# Patient Record
Sex: Female | Born: 1980 | Race: Black or African American | Hispanic: No | Marital: Married | Smoking: Never smoker
Health system: Southern US, Community
[De-identification: ages and names within clinical notes are randomized; demographics above are authoritative.]

## PROBLEM LIST (undated history)

## (undated) DIAGNOSIS — D649 Anemia, unspecified: Secondary | ICD-10-CM

---

## 2014-04-25 ENCOUNTER — Ambulatory Visit (INDEPENDENT_AMBULATORY_CARE_PROVIDER_SITE_OTHER): Payer: Self-pay

## 2014-04-25 ENCOUNTER — Ambulatory Visit: Payer: Self-pay

## 2014-04-25 ENCOUNTER — Ambulatory Visit (INDEPENDENT_AMBULATORY_CARE_PROVIDER_SITE_OTHER): Payer: Self-pay | Admitting: Family Medicine

## 2014-04-25 VITALS — BP 114/70 | HR 78 | Temp 98.2°F | Resp 18 | Ht 64.0 in | Wt 186.0 lb

## 2014-04-25 DIAGNOSIS — D649 Anemia, unspecified: Secondary | ICD-10-CM

## 2014-04-25 DIAGNOSIS — M25561 Pain in right knee: Secondary | ICD-10-CM

## 2014-04-25 DIAGNOSIS — M25562 Pain in left knee: Secondary | ICD-10-CM

## 2014-04-25 DIAGNOSIS — M7630 Iliotibial band syndrome, unspecified leg: Secondary | ICD-10-CM

## 2014-04-25 DIAGNOSIS — K146 Glossodynia: Secondary | ICD-10-CM

## 2014-04-25 LAB — POCT CBC
Granulocyte percent: 64.3 %G (ref 37–80)
HEMATOCRIT: 36.7 % — AB (ref 37.7–47.9)
HEMOGLOBIN: 11.7 g/dL — AB (ref 12.2–16.2)
Lymph, poc: 1.6 (ref 0.6–3.4)
MCH: 28.3 pg (ref 27–31.2)
MCHC: 31.7 g/dL — AB (ref 31.8–35.4)
MCV: 89.2 fL (ref 80–97)
MID (cbc): 0.4 (ref 0–0.9)
MPV: 8.6 fL (ref 0–99.8)
POC Granulocyte: 3.5 (ref 2–6.9)
POC LYMPH %: 28.4 % (ref 10–50)
POC MID %: 7.3 %M (ref 0–12)
Platelet Count, POC: 223 10*3/uL (ref 142–424)
RBC: 4.12 M/uL (ref 4.04–5.48)
RDW, POC: 13.6 %
WBC: 5.5 10*3/uL (ref 4.6–10.2)

## 2014-04-25 MED ORDER — LIDOCAINE VISCOUS 2 % MT SOLN: 10.0000 mL | OROMUCOSAL | Status: DC | PRN

## 2014-04-25 NOTE — Progress Notes (Signed)
Patient ID: Chelsea Ramirez, female   DOB: [birth year], 34 y.o.   MRN: [id number] Reviewed documentation and xray. Pt indepenedently seen by myself and agree w/ assessment and plan. Delman Cheadle, MD MPH

## 2014-04-25 NOTE — Progress Notes (Signed)
04/25/2014 at 5:56 PM  Chelsea Ramirez / DOB: 01/18/1980 / MRN: [id number]  The patient  does not have a problem list on file.  SUBJECTIVE  Chief complaint: Tongue Pain and Knee Pain   History of present illness: Chelsea Ramirez is 34 y.o. well appearing female presenting for  tongue pain which she states is moderate. This started 7 days ago and is worsening . Associated symptoms include pain with eating.  She denies swelling of the tongue and throat, and denies changes in color. Nothing aggravates her symptoms, and nothing alleviates them. She has tried nothing for this problem. Of note, she has been on a diet for the last two months in which she has cut out fast foods, and has been eating lots of meat but very little fruits and vegetables.   She also complains of worsening moderate bilateral knee pain. The right knee has been hurting for roughly 1 year, and the left over the last months.  Associated symptoms include bilateral lateral thigh pain and she denies swelling and warmth of the knee joints. She has tried 400 mg of Ibuprofen with good relief. Going up and down stairs and standing for long periods makes her pain worse.      She  has no past medical history on file.    Medications reviewed and updated by myself where necessary, and exist elsewhere in the encounter.   Chelsea Ramirez has No Known Allergies. She  reports that she has never smoked. She does not have any smokeless tobacco history on file. She reports that she does not drink alcohol or use illicit drugs. She  has no sexual activity history on file. The patient  has no past surgical history on file.  Her family history is not on file.  Review of Systems  Constitutional: Negative for fever and chills.  Respiratory: Negative for cough.   Cardiovascular: Negative for chest pain.  Musculoskeletal: Positive for joint pain. Negative for myalgias, back pain, falls and neck pain.  Neurological: Negative for dizziness and headaches.     OBJECTIVE  Her  height is 5\' 4"  (1.626 m) and weight is 186 lb (84.369 kg). Her oral temperature is 98.2 F (36.8 C). Her blood pressure is 114/70 and her pulse is 78. Her respiration is 18 and oxygen saturation is 100%.  The patient's body mass index is 31.91 kg/(m^2).  Physical Exam  Constitutional: She is oriented to person, place, and time. She appears well-developed and well-nourished.  HENT:  Mouth/Throat: Uvula is midline, oropharynx is clear and moist and mucous membranes are normal.    Neck: Normal range of motion.  Cardiovascular: Normal rate.   Respiratory: Effort normal.  GI: Soft. She exhibits no distension.  Musculoskeletal: Normal range of motion. She exhibits no edema or tenderness.       Right knee: Normal.       Left knee: Normal.       Legs: Neurological: She is alert and oriented to person, place, and time.  Skin: Skin is warm and dry.  Psychiatric: She has a normal mood and affect. Her behavior is normal. Judgment and thought content normal.    Results for orders placed or performed in visit on 04/25/14 (from the past 24 hour(s))  POCT CBC     Status: Abnormal   Collection Time: 04/25/14  5:51 PM  Result Value Ref Range   WBC 5.5 4.6 - 10.2 K/uL   Lymph, poc 1.6 0.6 - 3.4   POC LYMPH PERCENT 28.4  10 - 50 %L   MID (cbc) 0.4 0 - 0.9   POC MID % 7.3 0 - 12 %M   POC Granulocyte 3.5 2 - 6.9   Granulocyte percent 64.3 37 - 80 %G   RBC 4.12 4.04 - 5.48 M/uL   Hemoglobin 11.7 (A) 12.2 - 16.2 g/dL   HCT, POC 36.7 (A) 37.7 - 47.9 %   MCV 89.2 80 - 97 fL   MCH, POC 28.3 27 - 31.2 pg   MCHC 31.7 (A) 31.8 - 35.4 g/dL   RDW, POC 13.6 %   Platelet Count, POC 223 142 - 424 K/uL   MPV 8.6 0 - 99.8 fL   [facility] reading (PRIMARY) by  Dr. Brigitte Pulse: Bilateral knee films without osseous abnormalities.   ASSESSMENT & PLAN  Lizette was seen today for tongue pain and knee pain.  Diagnoses and all orders for this visit:  Bilateral knee pain: Physical exam and  radiographs unremarkable aside from some tenderness along the IT bands bilaterally.  Most likely musculoskeletal in nature.  Will send to PT for evaluation and treatment and have expressed the importance of the patient presenting for that appointment.  Orders: -     DG Knee Complete 4 Views Left; Future -     DG Knee Complete 4 Views Right; Future -     POCT CBC  Painful tongue: Unknown etiology at this point.  Mild anemia present and no previous CBC to compare to.  Will check folate, B12, ferritin, TIBC and Iron to further understand the nature of this anemia in the hopes it will shed light on her tongue pain.   Orders: -     Cancel: CBC with Differential/Platelet -     TSH -     POCT CBC   The patient was advised to call or come back to clinic if she does not see an improvement in symptoms, or worsens with the above plan.   Philis Fendt, MHS, PA-C [facility] 04/25/2014 5:56 PM

## 2014-04-26 LAB — FOLATE: FOLATE: 11 ng/mL

## 2014-04-26 LAB — IRON AND TIBC
%SAT: 16 % — AB (ref 20–55)
IRON: 49 ug/dL (ref 42–145)
TIBC: 315 ug/dL (ref 250–470)
UIBC: 266 ug/dL (ref 125–400)

## 2014-04-26 LAB — VITAMIN B12: Vitamin B-12: 754 pg/mL (ref 211–911)

## 2014-04-26 LAB — TSH: TSH: 1.333 u[IU]/mL (ref 0.350–4.500)

## 2014-04-26 LAB — FERRITIN: FERRITIN: 36 ng/mL (ref 10–291)

## 2014-11-02 ENCOUNTER — Ambulatory Visit (INDEPENDENT_AMBULATORY_CARE_PROVIDER_SITE_OTHER): Payer: Self-pay | Admitting: Family Medicine

## 2014-11-02 ENCOUNTER — Ambulatory Visit (INDEPENDENT_AMBULATORY_CARE_PROVIDER_SITE_OTHER): Payer: Self-pay

## 2014-11-02 ENCOUNTER — Ambulatory Visit: Payer: Self-pay

## 2014-11-02 VITALS — BP 124/80 | HR 79 | Temp 98.4°F | Resp 18 | Ht 65.5 in | Wt 185.0 lb

## 2014-11-02 DIAGNOSIS — M25552 Pain in left hip: Secondary | ICD-10-CM

## 2014-11-02 DIAGNOSIS — M25562 Pain in left knee: Secondary | ICD-10-CM

## 2014-11-02 DIAGNOSIS — M25561 Pain in right knee: Secondary | ICD-10-CM

## 2014-11-02 DIAGNOSIS — Z23 Encounter for immunization: Secondary | ICD-10-CM

## 2014-11-02 MED ORDER — MELOXICAM 7.5 MG PO TABS: 7.5000 mg | ORAL_TABLET | Freq: Every day | ORAL | Status: DC

## 2014-11-02 NOTE — Progress Notes (Signed)
Subjective:  This chart was scribed for Chelsea Ray, MD by Leandra Kern, Medical Scribe. This patient was seen in Room 9 and the patient's care was started at 12:36 PM.   Patient ID: Chelsea Ramirez, female    DOB: [birth year], 34 y.o.   MRN: [id number]  Chief Complaint  Patient presents with  . Follow-up    seen in 04/2014 continues to have pain  . Knee Injury    both knees  . Flu Vaccine    HPI HPI Comments: Chelsea Ramirez is a 34 y.o. female who presents to [facility] with her sister as an interpreter  for a follow up on knee pain. She had BL knee pain when seen in April of this year by Philis Fendt PA-C. At that time, left knee pain was present for three months, and the right for 1 year. Pain was worst with going up or down the stairs, and standing up for long periods of time. Pt was using Ibuprofen for the pain. Suspected IT band issue. Discuessed physcial therapy.   Pt notes that the pain has worsened since her last visit in April. She indicates that the pain is worst with any activity, and it is accompanied by knee swelling at time. Pt states that the pain in the left knee radiated form around the hip and radius to her lower leg, onset two months ago, however the pain is localized in the right knee.  Pt is not currently taking ibuprofen for the pain, and has not followed up with physical therapy. She denies any new traumatic injuries to the area, or back pain.   Pt is also requesting a flu shot today, and denies any recent sickness.  Pt works as a Public librarian.   There are no active problems to display for this patient.  History reviewed. No pertinent past medical history. History reviewed. No pertinent past surgical history. No Known Allergies Prior to Admission medications   Medication Sig Start Date End Date Taking? Authorizing Provider  lidocaine (XYLOCAINE) 2 % solution Use as directed 10 mLs in the mouth or throat every 3 (three) hours as  needed for mouth pain. Patient not taking: Reported on 11/02/2014 04/25/14   Tereasa Coop, PA-C   Social History   Social History  . Marital Status: Married    Spouse Name: N/A  . Number of Children: N/A  . Years of Education: N/A   Occupational History  . Not on file.   Social History Main Topics  . Smoking status: Never Smoker   . Smokeless tobacco: Not on file  . Alcohol Use: No  . Drug Use: No  . Sexual Activity: Not on file   Other Topics Concern  . Not on file   Social History Narrative    Review of Systems  Musculoskeletal: Positive for joint swelling and arthralgias.      Objective:   Physical Exam  Constitutional: She is oriented to person, place, and time. She appears well-developed and well-nourished. No distress.  HENT:  Head: Normocephalic and atraumatic.  Eyes: EOM are normal. Pupils are equal, round, and reactive to light.  Neck: Neck supple.  Cardiovascular: Normal rate.   Pulmonary/Chest: Effort normal.  Musculoskeletal:  Right knee: FROM. Skin is intact, no effusion, tenderness along the medial joint line. Initially non tender latera, but on repeated exam she was tender on both lateral and medial joints. Distal hamstrings  Left Knee: Possible trace effusion, but skin is intact. She  is guarded, but I am able to get her to 90 degree of flexion, full extension. Diffusely tender across her entire knee, no point is more tender than other. Negative varus and valgus test. She is guarded with McMurray for pain with flexion. Negative straight leg raise.   Left hip: internal and external rotation of the hip, tender along the anterior hip, but no pain with range of motion.   She is able to toe walk as well as heel walk. FROM of the lumbar spine. Lumbar spine is non tender. Sciatic notch was non tender.   Neurological: She is alert and oriented to person, place, and time. No cranial nerve deficit. She displays no Babinski's sign on the right side. She displays  no Babinski's sign on the left side.  Reflex Scores:      Patellar reflexes are 2+ on the right side and 2+ on the left side.      Achilles reflexes are 2+ on the right side and 2+ on the left side. Skin: Skin is warm and dry.  Psychiatric: She has a normal mood and affect. Her behavior is normal.  Nursing note and vitals reviewed.   Filed Vitals:   11/02/14 1207  BP: 124/80  Pulse: 79  Temp: 98.4 F (36.9 C)  TempSrc: Oral  Resp: 18  Height: 5' 5.5" (1.664 m)  Weight: 185 lb (83.915 kg)  SpO2: 97%   [facility] (PRIMARY) x-Ramirez report read by Dr. .Chelsea Ramirez: Left hip- no acute findings. Right knee- minimial degenerative change medially. Left knee- minimal degenerative change medially.      Assessment & Plan:   Chelsea Ramirez is a 34 y.o. female Left hip pain - Plan: DG HIP UNILAT W OR W/O PELVIS 2-3 VIEWS LEFT, meloxicam (MOBIC) 7.5 MG tablet, Ambulatory referral to Physical Therapy  Arthralgia of both knees - Plan: DG Knee Complete 4 Views Left, DG Knee Complete 4 Views Right, meloxicam (MOBIC) 7.5 MG tablet, Ambulatory referral to Physical Therapy  Need for influenza vaccination - Plan: Flu Vaccine QUAD 36+ mos IM, meloxicam (MOBIC) 7.5 MG tablet, Ambulatory referral to Physical Therapy  Recurrent, prolonged bilateral knee pain - left greater than right with suspected secondary hip pain from alteration in gait/stance for pain. Difficult exam, but possible slight effusion on left. Possible underlying OA with flair, vs degenerative meniscal disease. Initially suspected sciatica with radiating L leg pain, but back exam WNL.   - trial of mobic QD, refer to PT and consider ortho eval if worsening or not improved in next 6 weeks.   Meds ordered this encounter  Medications  . meloxicam (MOBIC) 7.5 MG tablet    Sig: Take 1 tablet (7.5 mg total) by mouth daily.    Dispense:  30 tablet    Refill:  0   Patient Instructions  Start Mobic once per day as needed (do not combine with  other NSAIDS such as ibuprofen). Heat or ice to affected areas as needed. I referred you to physical therapy, but if not improving or worse - I can refer you to orthopaedic doctor.  Follow up in 6 weeks.  Return to the clinic or go to the nearest emergency room if any of your symptoms worsen or new symptoms occur.  Knee Pain Knee pain is a very common symptom and can have many causes. Knee pain often goes away when you follow your health care provider's instructions for relieving pain and discomfort at home. However, knee pain can develop into a condition that  needs treatment. Some conditions may include:  Arthritis caused by wear and tear (osteoarthritis).  Arthritis caused by swelling and irritation (rheumatoid arthritis or gout).  A cyst or growth in your knee.  An infection in your knee joint.  An injury that will not heal.  Damage, swelling, or irritation of the tissues that support your knee (torn ligaments or tendinitis). If your knee pain continues, additional tests may be ordered to diagnose your condition. Tests may include X-rays or other imaging studies of your knee. You may also need to have fluid removed from your knee. Treatment for ongoing knee pain depends on the cause, but treatment may include:  Medicines to relieve pain or swelling.  Steroid injections in your knee.  Physical therapy.  Surgery. HOME CARE INSTRUCTIONS  Take medicines only as directed by your health care provider.  Rest your knee and keep it raised (elevated) while you are resting.  Do not do things that cause or worsen pain.  Avoid high-impact activities or exercises, such as running, jumping rope, or doing jumping jacks.  Apply ice to the knee area:  Put ice in a plastic bag.  Place a towel between your skin and the bag.  Leave the ice on for 20 minutes, 2-3 times a day.  Ask your health care provider if you should wear an elastic knee support.  Keep a pillow under your knee when you  sleep.  Lose weight if you are overweight. Extra weight can put pressure on your knee.  Do not use any tobacco products, including cigarettes, chewing tobacco, or electronic cigarettes. If you need help quitting, ask your health care provider. Smoking may slow the healing of any bone and joint problems that you may have. SEEK MEDICAL CARE IF:  Your knee pain continues, changes, or gets worse.  You have a fever along with knee pain.  Your knee buckles or locks up.  Your knee becomes more swollen. SEEK IMMEDIATE MEDICAL CARE IF:   Your knee joint feels hot to the touch.  You have chest pain or trouble breathing.   This information is not intended to replace advice given to you by your health care provider. Make sure you discuss any questions you have with your health care provider.   Document Released: 10/20/2006 Document Revised: 01/13/2014 Document Reviewed: 08/08/2013 Elsevier Interactive Patient Education 2016 Else[employer]   Hip Pain Your hip is the joint between your upper legs and your lower pelvis. The bones, cartilage, tendons, and muscles of your hip joint perform a lot of work each day supporting your body weight and allowing you to move around. Hip pain can range from a minor ache to severe pain in one or both of your hips. Pain may be felt on the inside of the hip joint near the groin, or the outside near the buttocks and upper thigh. You may have swelling or stiffness as well.  HOME CARE INSTRUCTIONS   Take medicines only as directed by your health care provider.  Apply ice to the injured area:  Put ice in a plastic bag.  Place a towel between your skin and the bag.  Leave the ice on for 15-20 minutes at a time, 3-4 times a day.  Keep your leg raised (elevated) when possible to lessen swelling.  Avoid activities that cause pain.  Follow specific exercises as directed by your health care provider.  Sleep with a pillow between your legs on your most  comfortable side.  Record how often you have  hip pain, the location of the pain, and what it feels like. SEEK MEDICAL CARE IF:   You are unable to put weight on your leg.  Your hip is red or swollen or very tender to touch.  Your pain or swelling continues or worsens after 1 week.  You have increasing difficulty walking.  You have a fever. SEEK IMMEDIATE MEDICAL CARE IF:   You have fallen.  You have a sudden increase in pain and swelling in your hip. MAKE SURE YOU:   Understand these instructions.  Will watch your condition.  Will get help right away if you are not doing well or get worse.   This information is not intended to replace advice given to you by your health care provider. Make sure you discuss any questions you have with your health care provider.   Document Released: 06/12/2009 Document Revised: 01/13/2014 Document Reviewed: 08/19/2012 Elsevier Interactive Patient Education [employer].        By signing my name below, I, Rawaa Al Rifaie, attest that this documentation has been prepared under the direction and in the presence of Chelsea Ray, MD.  Royann Shivers Rifaie, Medical Scribe. 11/02/2014.  12:50 PM. I personally performed the services described in this documentation, which was scribed in my presence. The recorded information has been reviewed and considered, and addended by me as needed.

## 2014-11-02 NOTE — Patient Instructions (Signed)
Start Mobic once per day as needed (do not combine with other NSAIDS such as ibuprofen). Heat or ice to affected areas as needed. I referred you to physical therapy, but if not improving or worse - I can refer you to orthopaedic doctor.  Follow up in 6 weeks.  Return to the clinic or go to the nearest emergency room if any of your symptoms worsen or new symptoms occur.  Knee Pain Knee pain is a very common symptom and can have many causes. Knee pain often goes away when you follow your health care provider's instructions for relieving pain and discomfort at home. However, knee pain can develop into a condition that needs treatment. Some conditions may include:  Arthritis caused by wear and tear (osteoarthritis).  Arthritis caused by swelling and irritation (rheumatoid arthritis or gout).  A cyst or growth in your knee.  An infection in your knee joint.  An injury that will not heal.  Damage, swelling, or irritation of the tissues that support your knee (torn ligaments or tendinitis). If your knee pain continues, additional tests may be ordered to diagnose your condition. Tests may include X-rays or other imaging studies of your knee. You may also need to have fluid removed from your knee. Treatment for ongoing knee pain depends on the cause, but treatment may include:  Medicines to relieve pain or swelling.  Steroid injections in your knee.  Physical therapy.  Surgery. HOME CARE INSTRUCTIONS  Take medicines only as directed by your health care provider.  Rest your knee and keep it raised (elevated) while you are resting.  Do not do things that cause or worsen pain.  Avoid high-impact activities or exercises, such as running, jumping rope, or doing jumping jacks.  Apply ice to the knee area:  Put ice in a plastic bag.  Place a towel between your skin and the bag.  Leave the ice on for 20 minutes, 2-3 times a day.  Ask your health care provider if you should wear an elastic  knee support.  Keep a pillow under your knee when you sleep.  Lose weight if you are overweight. Extra weight can put pressure on your knee.  Do not use any tobacco products, including cigarettes, chewing tobacco, or electronic cigarettes. If you need help quitting, ask your health care provider. Smoking may slow the healing of any bone and joint problems that you may have. SEEK MEDICAL CARE IF:  Your knee pain continues, changes, or gets worse.  You have a fever along with knee pain.  Your knee buckles or locks up.  Your knee becomes more swollen. SEEK IMMEDIATE MEDICAL CARE IF:   Your knee joint feels hot to the touch.  You have chest pain or trouble breathing.   This information is not intended to replace advice given to you by your health care provider. Make sure you discuss any questions you have with your health care provider.   Document Released: 10/20/2006 Document Revised: 01/13/2014 Document Reviewed: 08/08/2013 Elsevier Interactive Patient Education 2016 Else[employer] Inc.   Hip Pain Your hip is the joint between your upper legs and your lower pelvis. The bones, cartilage, tendons, and muscles of your hip joint perform a lot of work each day supporting your body weight and allowing you to move around. Hip pain can range from a minor ache to severe pain in one or both of your hips. Pain may be felt on the inside of the hip joint near the groin, or the outside near  the buttocks and upper thigh. You may have swelling or stiffness as well.  HOME CARE INSTRUCTIONS   Take medicines only as directed by your health care provider.  Apply ice to the injured area:  Put ice in a plastic bag.  Place a towel between your skin and the bag.  Leave the ice on for 15-20 minutes at a time, 3-4 times a day.  Keep your leg raised (elevated) when possible to lessen swelling.  Avoid activities that cause pain.  Follow specific exercises as directed by your health care  provider.  Sleep with a pillow between your legs on your most comfortable side.  Record how often you have hip pain, the location of the pain, and what it feels like. SEEK MEDICAL CARE IF:   You are unable to put weight on your leg.  Your hip is red or swollen or very tender to touch.  Your pain or swelling continues or worsens after 1 week.  You have increasing difficulty walking.  You have a fever. SEEK IMMEDIATE MEDICAL CARE IF:   You have fallen.  You have a sudden increase in pain and swelling in your hip. MAKE SURE YOU:   Understand these instructions.  Will watch your condition.  Will get help right away if you are not doing well or get worse.   This information is not intended to replace advice given to you by your health care provider. Make sure you discuss any questions you have with your health care provider.   Document Released: 06/12/2009 Document Revised: 01/13/2014 Document Reviewed: 08/19/2012 Elsevier Interactive Patient Education [employer].

## 2014-11-08 ENCOUNTER — Ambulatory Visit: Payer: Self-pay | Attending: Family Medicine | Admitting: Physical Therapy

## 2014-11-08 DIAGNOSIS — R29898 Other symptoms and signs involving the musculoskeletal system: Secondary | ICD-10-CM | POA: Insufficient documentation

## 2014-11-08 DIAGNOSIS — M25562 Pain in left knee: Secondary | ICD-10-CM | POA: Insufficient documentation

## 2014-11-08 DIAGNOSIS — M25561 Pain in right knee: Secondary | ICD-10-CM | POA: Insufficient documentation

## 2014-11-08 DIAGNOSIS — M25552 Pain in left hip: Secondary | ICD-10-CM | POA: Insufficient documentation

## 2014-11-08 DIAGNOSIS — R293 Abnormal posture: Secondary | ICD-10-CM | POA: Insufficient documentation

## 2014-11-08 NOTE — Patient Instructions (Signed)
   Shateka Petrea PT, DPT, LAT, ATC  [facility] Phone: [phone]     

## 2014-11-08 NOTE — Therapy (Addendum)
[place], [place], [address] Phone: [phone]   Fax:  [phone]  Physical Therapy Treatment  Patient Details  Name: Gerene Nedd MRN: [id number] Date of Birth: 05-28-1980 Referring Provider: Wendie Agreste  Encounter Date: 11/08/2014      PT End of Session - 11/08/14 1523    Visit Number 1   Number of Visits 8   Date for PT Re-Evaluation 01/03/15   Authorization Type self pay   PT Start Time 4481   PT Stop Time 1500   PT Time Calculation (min) 45 min   Activity Tolerance Patient tolerated treatment well   Behavior During Therapy [facility] for tasks assessed/performed      No past medical history on file.  No past surgical history on file.  There were no vitals filed for this visit.  Visit Diagnosis:  Left hip pain - Plan: PT plan of care cert/re-cert  Bilateral knee pain - Plan: PT plan of care cert/re-cert  Bilateral leg weakness - Plan: PT plan of care cert/re-cert  Abnormal posture - Plan: PT plan of care cert/re-cert      Subjective Assessment - 11/08/14 1424    Subjective Pt is a 34 y.o F with CC of L hip and bil knee pain that has been going on for over 3 months that started insidiously. since onset she reports the pain has gotten worse.    Currently in Pain? Yes   Pain Score 7    Pain Location Hip   Pain Orientation Right   Pain Descriptors / Indicators Sharp;Aching   Pain Type Chronic pain   Pain Onset More than a month ago   Pain Frequency Constant   Aggravating Factors  worse with sitting, standing,    Pain Relieving Factors nothing makes it better   Multiple Pain Sites Yes   Pain Location Knee   Pain Orientation Right;Left   Pain Descriptors / Indicators Sharp   Pain Type Chronic pain   Pain Onset More than a month ago   Pain Frequency Constant   Aggravating Factors  sitting and standing with sitting be worse.    Pain Relieving Factors notthing makes it better             [facility] PT Assessment - 11/08/14 1430    Assessment   Medical Diagnosis L hip pain and bil knee pain   Referring Provider Wendie Agreste   Onset Date/Surgical Date --  3 months   Hand Dominance Right   Next MD Visit to make one PRN   Prior Therapy no   Precautions   Precautions None   Restrictions   Weight Bearing Restrictions No   Balance Screen   Has the patient fallen in the past 6 months Yes   How many times? 1   Has the patient had a decrease in activity level because of a fear of falling?  No   Is the patient reluctant to leave their home because of a fear of falling?  No   Home Environment   Living Environment Private residence   Living Arrangements Alone   Type of [place] to enter   Prior Function   Level of Independence Independent;Independent with basic ADLs   Cognition   Overall Cognitive Status Within Functional Limits for tasks assessed   Observation/Other Assessments   Focus on Therapeutic Outcomes (FOTO)  50%  predicted 33% limited   Posture/Postural Control   Posture/Postural Control  Postural limitations   Postural Limitations Rounded Shoulders;Forward head   ROM / Strength   AROM / PROM / Strength AROM;PROM;Strength   AROM   AROM Assessment Site Hip;Knee   Right/Left Hip Right;Left   Right Hip Extension 8   Right Hip Flexion 90   Right Hip ABduction 20   Right Hip ADduction 10   Left Hip Extension 8   Left Hip Flexion 90   Left Hip ABduction 20   Left Hip ADduction 10   Right/Left Knee Left;Right   Right Knee Extension -3   Right Knee Flexion 120   Left Knee Extension -1   Left Knee Flexion 120   PROM   PROM Assessment Site Hip;Knee   Right/Left Hip Right;Left   Left Hip Extension 15   Left Hip Flexion 110   Right/Left Knee Right;Left   Right Knee Extension 0   Right Knee Flexion 125   Left Knee Extension 0   Left Knee Flexion 126   Strength   Strength Assessment Site Hip;Knee   Right/Left Hip  Right;Left   Right Hip Flexion 4+/5   Right Hip ABduction 5/5   Right Hip ADduction 5/5   Left Hip Flexion 4-/5  pain down into the L foot during testing   Left Hip ABduction 5/5   Left Hip ADduction 5/5   Right/Left Knee Right;Left   Right Knee Flexion 3+/5  pain during testing   Right Knee Extension 3+/5   Left Knee Flexion 3+/5   Left Knee Extension 3+/5  pain during movement   Palpation   Palpation comment tenderness located in the Lateral aspect of the L hip with radiating pain down the foot.                             PT Education - 11/08/14 1522    Education provided Yes   Education Details evaluation findings, POC, goals, HEP   Person(s) Educated Patient   Methods Explanation   Comprehension Verbalized understanding          PT Short Term Goals - 11/08/14 1528    PT SHORT TERM GOAL #1   Title pt will be I with initial HEP (12/08/2014)   Time 4   Period Weeks   Status New   PT SHORT TERM GOAL #2   Title pt will demonstrate hip pain to < 4/10 to assist with exercise progression (12/08/2014)   Time 4   Period Weeks   Status New   PT SHORT TERM GOAL #3   Title pt will be able to verbalize and demonstrate techniques to reduce hip and knee pain / reinjury via postural awareness, lifting and carrying mechanics and HEP (12/08/2014)   Time 4   Period Weeks   Status New   PT SHORT TERM GOAL #4   Title pt will increase her FOTO score by > 10 points (12/08/2014)   Time 4   Period Weeks   Status New           PT Long Term Goals - 11/08/14 1531    PT LONG TERM GOAL #1   Title pt will be I with all HEP as of last visit (01/03/2015)   Time 8   Period Weeks   Status New   PT LONG TERM GOAL #2   Title pt will report decreased hip/ knee pain to < 2/10 to assist with ADLs (01/03/2015)   Time 8   Period Weeks  Status New   PT LONG TERM GOAL #3   Title pt will increase bil hip and knee strength to > 4/5 with < 2/10 pain to assist with  prolonged walking/ standing activities (01/03/2015)   Time 8   Period Weeks   Status New   PT LONG TERM GOAL #4   Title pt will be able to walk/stand for > 25 minutes with < 2/10 pain to help promote functional community ambulation (01/03/2015)   Time 8   Period Weeks   Status New   PT LONG TERM GOAL #5   Title pt will increase her FOTO score to > 60 to demonstrate increased function at discharge (01/03/2015)   Time 8   Period Weeks   Status New               Plan - 11/08/14 1524    Clinical Impression Statement Ms. Blossom reports to OPPT with CC of L hip and B knee pain. she reports pain in the lateral apsect of the L hip with referral of symptoms down the lateral aspect of the L knee into the ankle. She demosntrates limited AROM/prom of the hip due to pain but bil knee AROM is WFL. MMT revealed significant weakness secondary to pain during testing. palpation reveals glute med/minmius tightness. limited evaluation today due to language constraints, plan to do special testing next visit. She would benefit from physcial therapy to decreased pain and return to PLOF by addressing the impairments listed.    Pt will benefit from skilled therapeutic intervention in order to improve on the following deficits Pain;Improper body mechanics;Postural dysfunction;Decreased strength;Hypomobility;Decreased activity tolerance;Decreased endurance   Rehab Potential Good   PT Frequency Biweekly  every other week    PT Duration 8 weeks   PT Treatment/Interventions ADLs/Self Care Home Management;Cryotherapy;Electrical Stimulation;Iontophoresis 67m/ml Dexamethasone;Moist Heat;Therapeutic exercise;Therapeutic activities;Ultrasound;Manual techniques;Dry needling;Passive range of motion;Taping;Patient/family education   PT Next Visit Plan assess response to HEP, manual for hip tightness, modalities for pain, posture education.    PT Home Exercise Plan itband stretching, hamstring stretch, clam shells,  bridge, SLR   Consulted and Agree with Plan of Care Patient        Problem List There are no active problems to display for this patient.  KStarr LakePT, DPT, LAT, ATC  11/08/2014  3:38 PM    [facility] [address]Phone: [phone]  Fax:  [phone] Name: FGaladriel ShroffMRN: [id number] of Birth: [birth year]   PHYSICAL THERAPY DISCHARGE SUMMARY  Visits from Start of Care: 1  Current functional level related to goals / functional outcomes: See goals   Remaining deficits: Unknown due to pt not returning.   Education / Equipment: HEP  Plan:                                                    Patient goals were not met. Patient is being discharged due to not returning since the last visit.  ?????       Marque Bango PT, DPT, LAT, ATC  01/03/2015  3:18 PM

## 2014-12-07 ENCOUNTER — Ambulatory Visit: Payer: Self-pay | Attending: Family Medicine | Admitting: Physical Therapy

## 2014-12-20 ENCOUNTER — Ambulatory Visit: Payer: Self-pay | Admitting: Physical Therapy

## 2015-01-03 ENCOUNTER — Ambulatory Visit: Payer: Self-pay | Admitting: Physical Therapy

## 2016-09-03 IMAGING — CR DG KNEE COMPLETE 4+V*L*
4 series · 4 of 4 positions shown · non-contrast
Comparison: 04/25/2014

CLINICAL DATA: Arthralgia of both knees.  No known injury.

EXAM:
LEFT KNEE - COMPLETE 4+ VIEW

[AP]
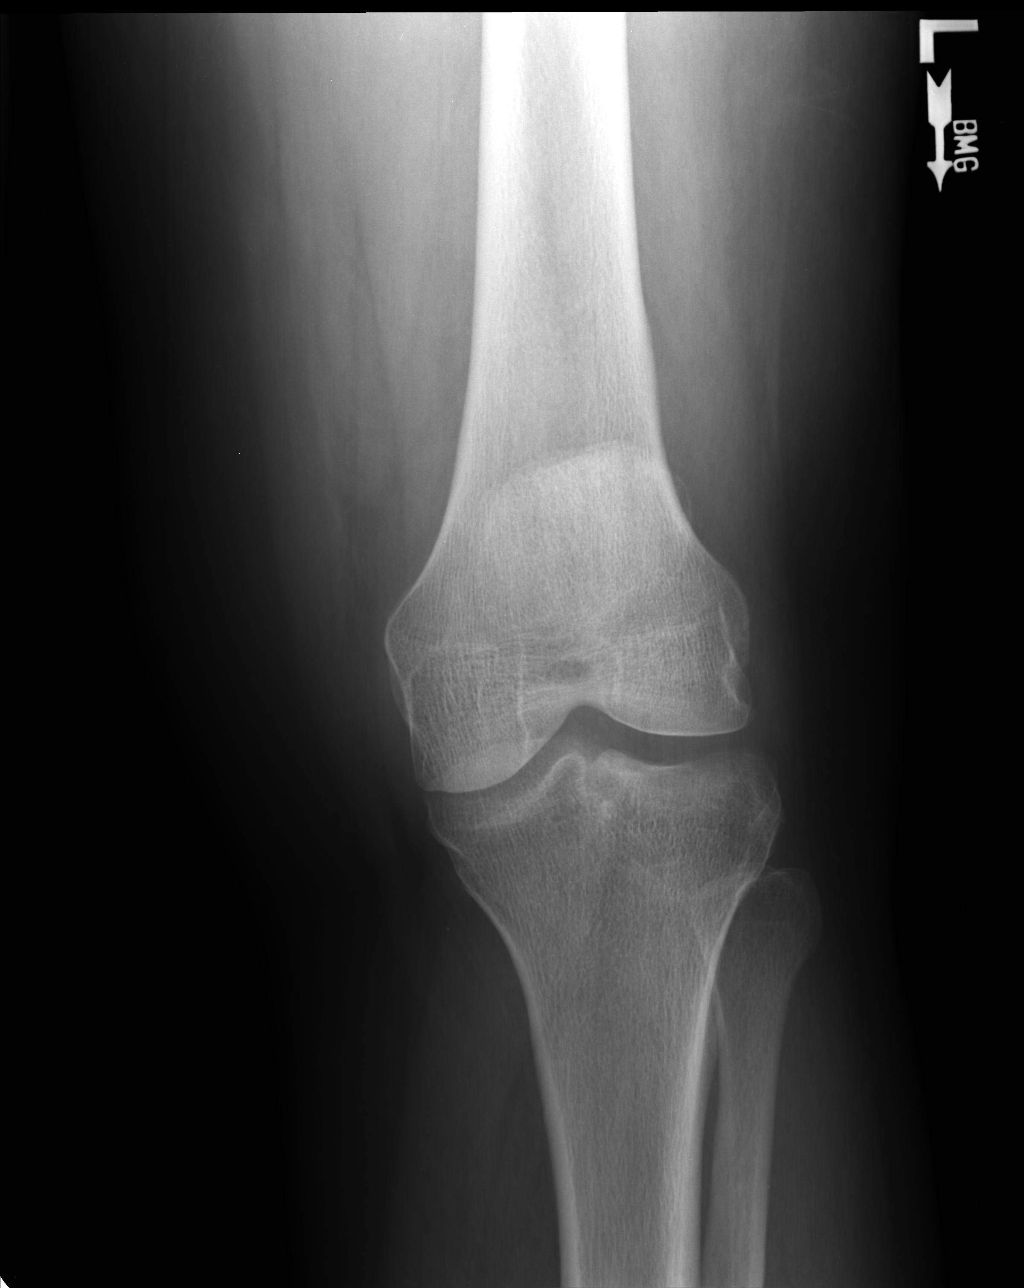

[ap axial]
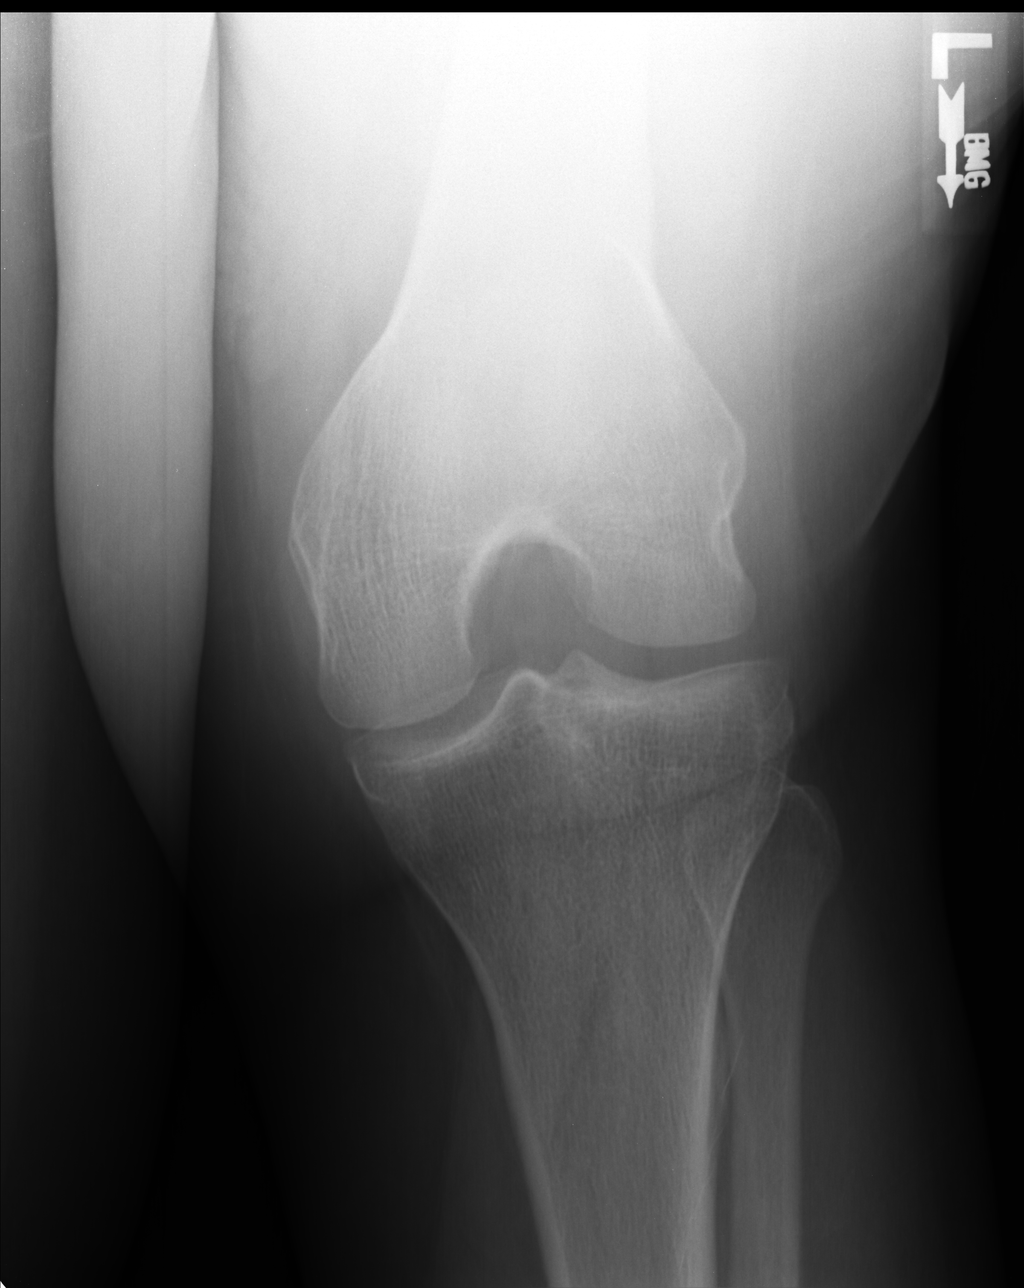

[lateral]
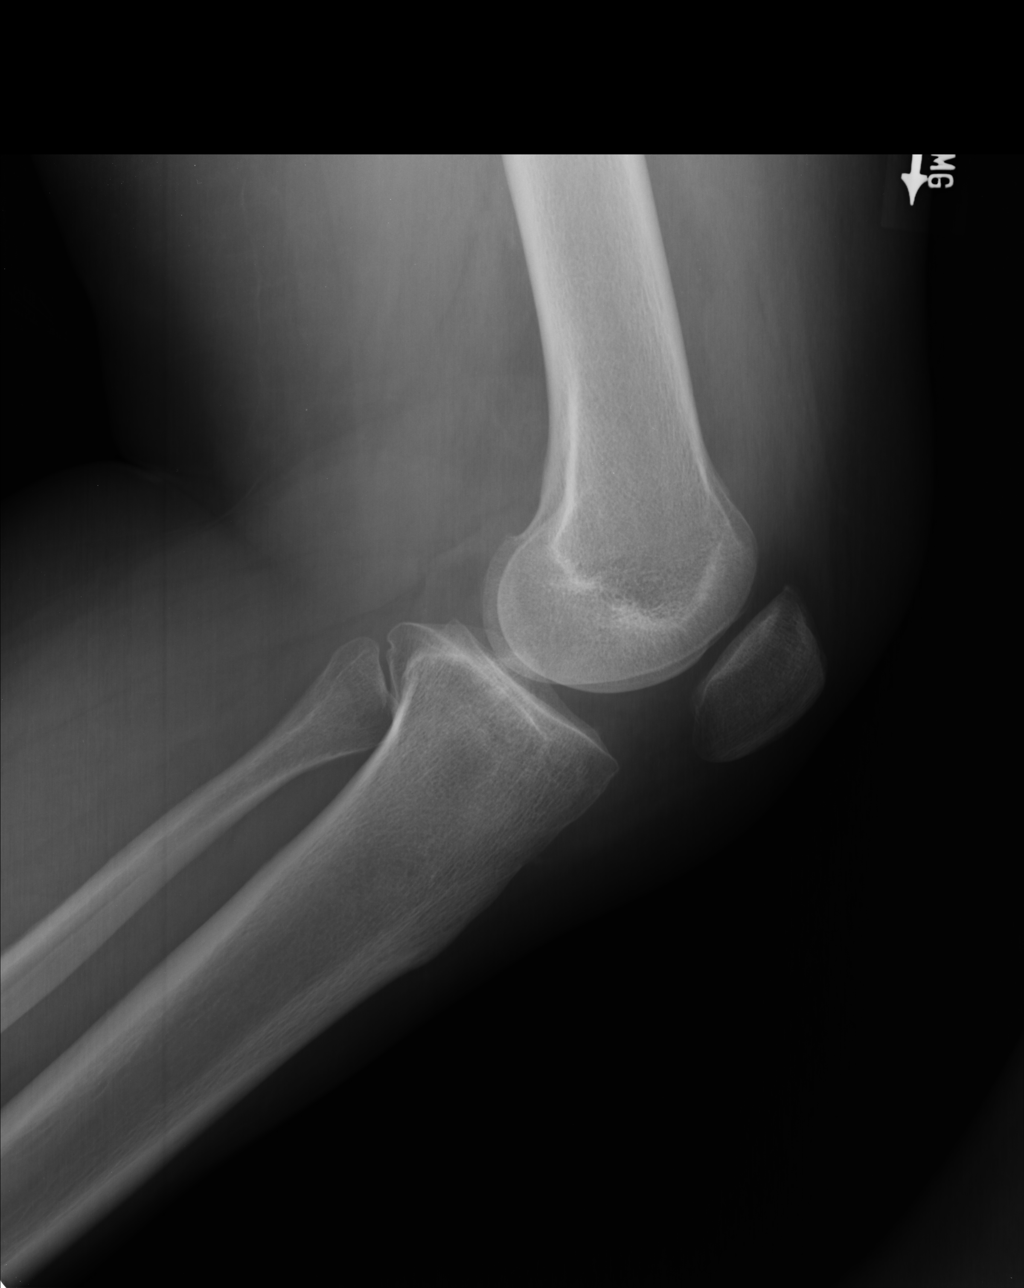

[sunrise]
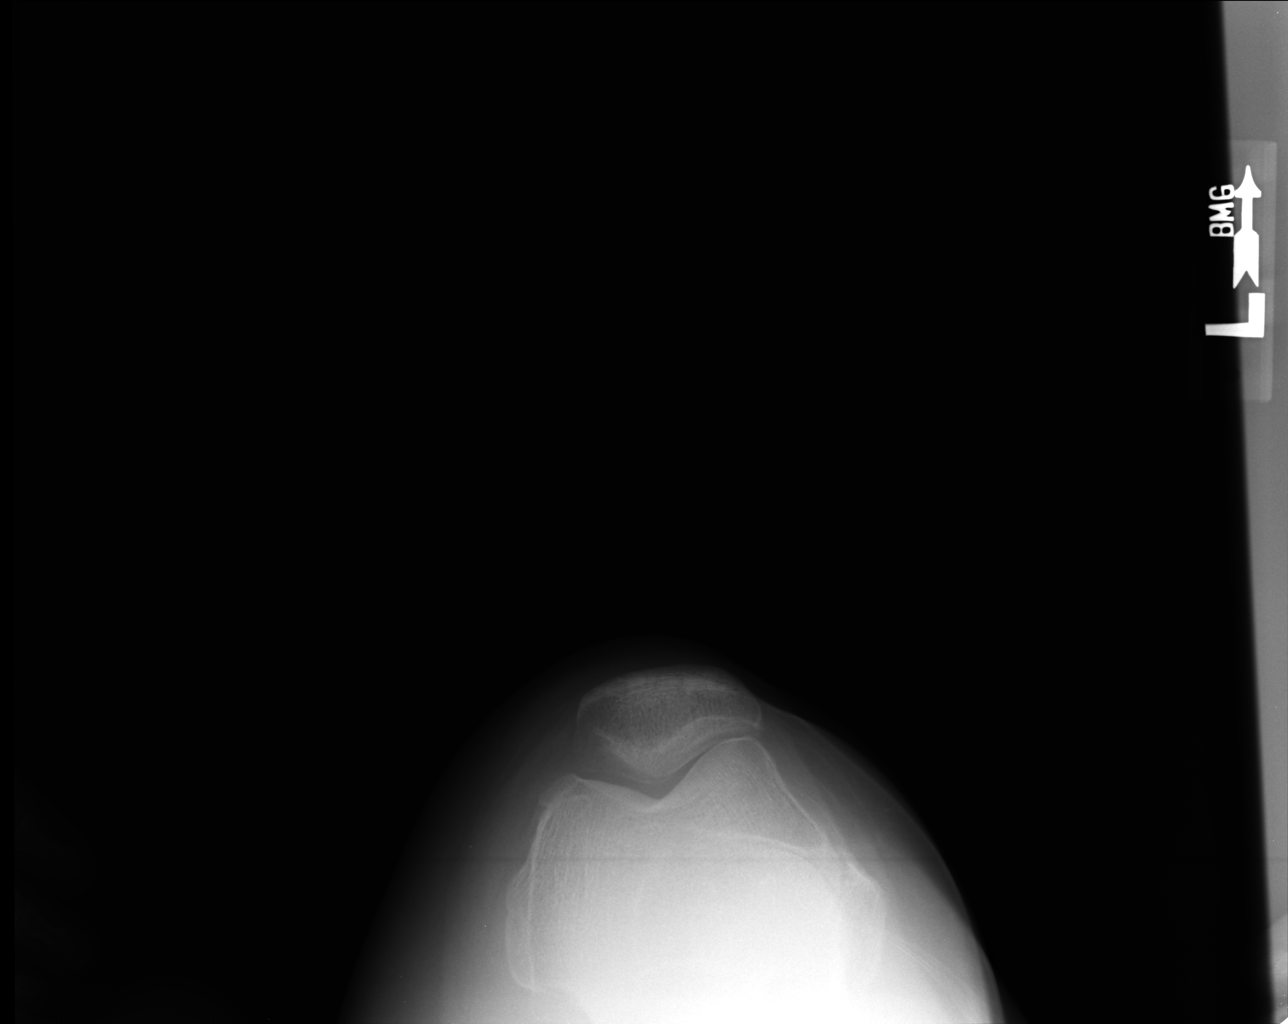

[4 of 4 positions shown; findings below may reference images not displayed]

FINDINGS: Mild lateral patellofemoral narrowing on sunrise view suggested, but
this could be from obliquity. There are small marginal spurs at the
patellofemoral compartment. No medial or lateral compartment
narrowing as permitted by obliquity. No fracture, erosion, or focal
bone lesion. No suspected joint effusion.
IMPRESSION: 1. No acute finding.
2. Minimal degenerative marginal spurring.

## 2018-02-04 ENCOUNTER — Inpatient Hospital Stay: Payer: Self-pay | Attending: Internal Medicine | Admitting: Internal Medicine

## 2018-02-12 ENCOUNTER — Encounter (HOSPITAL_COMMUNITY): Payer: Self-pay

## 2018-02-12 ENCOUNTER — Observation Stay (HOSPITAL_COMMUNITY)
Admission: EM | Admit: 2018-02-12 | Discharge: 2018-02-13 | Disposition: A | Discharge status: Home / Self Care | Payer: 59 | Attending: Internal Medicine | Admitting: Internal Medicine

## 2018-02-12 DIAGNOSIS — Z791 Long term (current) use of non-steroidal anti-inflammatories (NSAID): Secondary | ICD-10-CM | POA: Insufficient documentation

## 2018-02-12 DIAGNOSIS — N920 Excessive and frequent menstruation with regular cycle: Secondary | ICD-10-CM | POA: Diagnosis not present

## 2018-02-12 DIAGNOSIS — D508 Other iron deficiency anemias: Secondary | ICD-10-CM

## 2018-02-12 DIAGNOSIS — D500 Iron deficiency anemia secondary to blood loss (chronic): Secondary | ICD-10-CM | POA: Diagnosis not present

## 2018-02-12 DIAGNOSIS — D259 Leiomyoma of uterus, unspecified: Secondary | ICD-10-CM | POA: Diagnosis not present

## 2018-02-12 DIAGNOSIS — R7989 Other specified abnormal findings of blood chemistry: Secondary | ICD-10-CM | POA: Diagnosis not present

## 2018-02-12 DIAGNOSIS — D649 Anemia, unspecified: Secondary | ICD-10-CM

## 2018-02-12 HISTORY — DX: Anemia, unspecified: D64.9

## 2018-02-12 LAB — CBC WITH DIFFERENTIAL/PLATELET
Abs Immature Granulocytes: 0.02 10*3/uL (ref 0.00–0.07)
Basophils Absolute: 0 10*3/uL (ref 0.0–0.1)
Basophils Relative: 1 %
Eosinophils Absolute: 0.1 10*3/uL (ref 0.0–0.5)
Eosinophils Relative: 1 %
HCT: 25.7 % — ABNORMAL LOW (ref 36.0–46.0)
Hemoglobin: 7.2 g/dL — ABNORMAL LOW (ref 12.0–15.0)
Immature Granulocytes: 0 %
Lymphocytes Relative: 27 %
Lymphs Abs: 1.5 10*3/uL (ref 0.7–4.0)
MCH: 19.6 pg — ABNORMAL LOW (ref 26.0–34.0)
MCHC: 28 g/dL — ABNORMAL LOW (ref 30.0–36.0)
MCV: 70 fL — ABNORMAL LOW (ref 80.0–100.0)
Monocytes Absolute: 0.7 10*3/uL (ref 0.1–1.0)
Monocytes Relative: 13 %
Neutro Abs: 3.3 10*3/uL (ref 1.7–7.7)
Neutrophils Relative %: 58 %
Platelets: ADEQUATE 10*3/uL (ref 150–400)
RBC: 3.67 MIL/uL — ABNORMAL LOW (ref 3.87–5.11)
RDW: 20.4 % — ABNORMAL HIGH (ref 11.5–15.5)
WBC: 5.6 10*3/uL (ref 4.0–10.5)
nRBC: 0 % (ref 0.0–0.2)

## 2018-02-12 LAB — BASIC METABOLIC PANEL
Anion gap: 9 (ref 5–15)
BUN: 12 mg/dL (ref 6–20)
CO2: 22 mmol/L (ref 22–32)
Calcium: 9 mg/dL (ref 8.9–10.3)
Chloride: 107 mmol/L (ref 98–111)
Creatinine, Ser: 0.66 mg/dL (ref 0.44–1.00)
GFR calc Af Amer: >60 mL/min (ref >60)
GFR calc non Af Amer: >60 mL/min (ref >60)
Glucose, Bld: 84 mg/dL (ref 70–99)
Potassium: 3.9 mmol/L (ref 3.5–5.1)
Sodium: 138 mmol/L (ref 135–145)

## 2018-02-12 LAB — I-STAT BETA HCG BLOOD, ED (MC, WL, AP ONLY): I-stat hCG, quantitative: <5 m[IU]/mL (ref <5)

## 2018-02-12 LAB — ABO/RH: ABO/RH(D): A POS

## 2018-02-12 LAB — PREPARE RBC (CROSSMATCH)

## 2018-02-12 MED ORDER — SODIUM CHLORIDE 0.9 % IV SOLN
INTRAVENOUS | Status: DC
  Administered 2018-02-12 – 2018-02-13 (×2): via INTRAVENOUS

## 2018-02-12 MED ORDER — ONDANSETRON HCL 4 MG/2ML IJ SOLN
4.0000 mg | Freq: Four times a day (QID) | INTRAMUSCULAR | Status: DC | PRN
  Administered 2018-02-12: 4 mg via INTRAVENOUS
  Filled 2018-02-12: qty 2

## 2018-02-12 MED ORDER — ONDANSETRON HCL 4 MG PO TABS: 4.0000 mg | ORAL_TABLET | Freq: Four times a day (QID) | ORAL | Status: DC | PRN

## 2018-02-12 MED ORDER — FERROUS SULFATE 325 (65 FE) MG PO TABS
325.0000 mg | ORAL_TABLET | Freq: Three times a day (TID) | ORAL | Status: DC
  Administered 2018-02-12 – 2018-02-13 (×3): 325 mg via ORAL
  Filled 2018-02-12 (×3): qty 1

## 2018-02-12 MED ORDER — SODIUM CHLORIDE 0.9 % IV SOLN
10.0000 mL/h | Freq: Once | INTRAVENOUS | Status: AC
  Administered 2018-02-12: 10 mL/h via INTRAVENOUS

## 2018-02-12 MED ORDER — SODIUM CHLORIDE 0.9 % IV BOLUS
1000.0000 mL | Freq: Once | INTRAVENOUS | Status: AC
  Administered 2018-02-12: 1000 mL via INTRAVENOUS

## 2018-02-12 MED ORDER — ACETAMINOPHEN 650 MG RE SUPP: 650.0000 mg | Freq: Four times a day (QID) | RECTAL | Status: DC | PRN

## 2018-02-12 MED ORDER — IBUPROFEN 600 MG PO TABS
600.0000 mg | ORAL_TABLET | Freq: Once | ORAL | Status: AC
  Administered 2018-02-12: 600 mg via ORAL
  Filled 2018-02-12 (×2): qty 1

## 2018-02-12 MED ORDER — ACETAMINOPHEN 325 MG PO TABS
650.0000 mg | ORAL_TABLET | Freq: Four times a day (QID) | ORAL | Status: DC | PRN
  Administered 2018-02-12: 650 mg via ORAL
  Filled 2018-02-12: qty 2

## 2018-02-12 NOTE — Progress Notes (Signed)
New Admission Note:   Arrival Method: WC Mental Orientation: A&O x$ Telemetry: Not Ordered Assessment: Completed Skin: WDL IV: WDL Pain: Denies Safety Measures: Safety Fall Prevention Plan has been given, discussed and signed Admission: Completed Unit Orientation: Patient has been orientated to the room, unit and staff.  Family:  Husband at bedside  Orders have been reviewed and implemented. Will continue to monitor the patient. Call light has been placed within reach and bed alarm has been activated.    Aneta Mins, RN

## 2018-02-12 NOTE — ED Notes (Signed)
Pt refused POC occult blood collection.

## 2018-02-12 NOTE — ED Provider Notes (Signed)
[facility] EMERGENCY DEPARTMENT Provider Note   CSN: [id number] Arrival date & time: 02/12/18  1218     History   Chief Complaint Chief Complaint  Patient presents with  . Dizziness  . Headache  . Fatigue    HPI Chelsea Ramirez is a 38 y.o. female.  38 year old female with history of anemia presents from her doctor's office after having over a one-month history of similar symptoms.  Has been on iron therapy without improvement.  Does have a history of having heavy menstrual periods.  Denies any vaginal bleeding currently.  No black or bloody stools.  No hematemesis.  Describes whole body weakness worse with standing for long periods of time.  No associated chest pain or diaphoresis.  Mild diffuse headache with some nausea.  Went to her doctor's office and was sent here for further evaluation.  Patient does speaks [place] and the interpreter service was used     Past Medical History:  Diagnosis Date  . Anemia     There are no active problems to display for this patient.   History reviewed. No pertinent surgical history.   OB History   No obstetric history on file.      Home Medications    Prior to Admission medications   Medication Sig Start Date End Date Taking? Authorizing Provider  meloxicam (MOBIC) 7.5 MG tablet Take 1 tablet (7.5 mg total) by mouth daily. 11/02/14   Wendie Agreste, MD    Family History No family history on file.  Social History Social History   Tobacco Use  . Smoking status: Never Smoker  Substance Use Topics  . Alcohol use: No    Alcohol/week: 0.0 standard drinks  . Drug use: No     Allergies   Patient has no known allergies.   Review of Systems Review of Systems  All other systems reviewed and are negative.    Physical Exam Updated Vital Signs BP 134/87 (BP Location: Right Arm)   Pulse 91   Temp 98.2 F (36.8 C) (Oral)   Resp 16   SpO2 98%   Physical Exam Vitals signs and nursing note  reviewed.  Constitutional:      General: She is not in acute distress.    Appearance: Normal appearance. She is well-developed. She is not toxic-appearing.  HENT:     Head: Normocephalic and atraumatic.  Eyes:     General: Lids are normal.     Conjunctiva/sclera: Conjunctivae normal.     Pupils: Pupils are equal, round, and reactive to light.  Neck:     Musculoskeletal: Normal range of motion and neck supple.     Thyroid: No thyroid mass.     Trachea: No tracheal deviation.  Cardiovascular:     Rate and Rhythm: Normal rate and regular rhythm.     Heart sounds: Normal heart sounds. No murmur. No gallop.   Pulmonary:     Effort: Pulmonary effort is normal. No respiratory distress.     Breath sounds: Normal breath sounds. No stridor. No decreased breath sounds, wheezing, rhonchi or rales.  Abdominal:     General: Bowel sounds are normal. There is no distension.     Palpations: Abdomen is soft.     Tenderness: There is no abdominal tenderness. There is no rebound.  Musculoskeletal: Normal range of motion.        General: No tenderness.  Skin:    General: Skin is warm and dry.     Findings: No abrasion  or rash.  Neurological:     Mental Status: She is alert and oriented to person, place, and time.     GCS: GCS eye subscore is 4. GCS verbal subscore is 5. GCS motor subscore is 6.     Cranial Nerves: No cranial nerve deficit.     Sensory: No sensory deficit.  Psychiatric:        Speech: Speech normal.        Behavior: Behavior normal.      ED Treatments / Results  Labs (all labs ordered are listed, but only abnormal results are displayed) Labs Reviewed  CBC WITH DIFFERENTIAL/PLATELET  BASIC METABOLIC PANEL  I-STAT BETA HCG BLOOD, ED ([facility], [facility], [facility] ONLY)  TYPE AND SCREEN    EKG None  Radiology No results found.  Procedures Procedures (including critical care time)  Medications Ordered in ED Medications  sodium chloride 0.9 % bolus 1,000 mL (has no administration in  time range)  0.9 %  sodium chloride infusion (has no administration in time range)     Initial Impression / Assessment and Plan / ED Course  I have reviewed the triage vital signs and the nursing notes.  Pertinent labs & imaging results that were available during my care of the patient were reviewed by me and considered in my medical decision making (see chart for details).     Patient with symptomatic anemia here with hemoglobin 7.2.  Treatments of packed red blood cells ordered and patient will be admitted to the hospitalist service  Final Clinical Impressions(s) / ED Diagnoses   Final diagnoses:  None    ED Discharge Orders    None       Lacretia Leigh, MD 02/12/18 1503

## 2018-02-12 NOTE — ED Triage Notes (Signed)
Pt sent here from PCP for evaluation of dizziness, headaches and nausea for 1 week. Pt has hx of anemia. Pt speaks french. Pt a.o, nad noted

## 2018-02-12 NOTE — H&P (Signed)
History and Physical    Chelsea Ramirez SJG:[id number] DOB: Jul 20, 1980 DOA: 02/12/2018  PCP: Patient, No Pcp Per , Dr. Darlina Guys Patient coming from: home   I have personally briefly reviewed patient's old medical records available.   Chief Complaint: Fatigue and weakness.  HPI: Chelsea Ramirez is a 38 y.o. female with medical history significant of menorrhagia, chronic iron deficiency anemia who is taking oral iron but not responding well presented to the primary care physician's office with symptomatic anemia and was sent to ER.  According to the patient and her sister at the bedside, she has menorrhagia, she is diagnosed with uterine fibroid and bleeds heavily on menstrual.  She has been following up with her primary care physician, she has been taking iron once a day.Since over a month, she feels weak, she feels fatigued.  Short of breath on ambulation.  She also gets headache and dizziness on walking around.  Denies any hematemesis, hemoptysis.  Denies any nausea.  Appetite is good.  Denies any melena or hematochezia. With this symptoms, she went to her primary care physician today and was sent to ER. ED Course: Hemodynamically stable.  Hemoglobin is 7.2.  Patient declined rectal exam and FOBT testing.  Currently not actively menstruating.  Patient consented for 2 units of red cells. She asked me if I give her some medicine that helps to stop bleeding. She was referred to gynecologist before she has not gone to appointments  Review of Systems: As per HPI otherwise 10 point review of systems negative.    Past Medical History:  Diagnosis Date  . Anemia     History reviewed. No pertinent surgical history.   reports that she has never smoked. She does not have any smokeless tobacco history on file. She reports that she does not drink alcohol or use drugs.  No Known Allergies  History reviewed. No pertinent family history.   Prior to Admission medications   Medication Sig Start  Date End Date Taking? Authorizing Provider  meloxicam (MOBIC) 7.5 MG tablet Take 1 tablet (7.5 mg total) by mouth daily. 11/02/14   Wendie Agreste, MD    Physical Exam: Vitals:   02/12/18 1243 02/12/18 1500  BP: 134/87 122/78  Pulse: 91 86  Resp: 16 14  Temp: 98.2 F (36.8 C)   TempSrc: Oral   SpO2: 98% 99%    Constitutional: NAD, calm, comfortable Vitals:   02/12/18 1243 02/12/18 1500  BP: 134/87 122/78  Pulse: 91 86  Resp: 16 14  Temp: 98.2 F (36.8 C)   TempSrc: Oral   SpO2: 98% 99%   Eyes: PERRL, lids and conjunctivae normal ENMT: Mucous membranes are pale. Posterior pharynx clear of any exudate or lesions.Normal dentition.  Neck: normal, supple, no masses, no thyromegaly Respiratory: clear to auscultation bilaterally, no wheezing, no crackles. Normal respiratory effort. No accessory muscle use.  Cardiovascular: Regular rate and rhythm, no murmurs / rubs / gallops. No extremity edema. 2+ pedal pulses. No carotid bruits.  Abdomen: no tenderness, no masses palpated. No hepatosplenomegaly. Bowel sounds positive.  Musculoskeletal: no clubbing / cyanosis. No joint deformity upper and lower extremities. Good ROM, no contractures. Normal muscle tone.  Skin: no rashes, lesions, ulcers. No induration Neurologic: CN 2-12 grossly intact. Sensation intact, DTR normal. Strength 5/5 in all 4.  Psychiatric: Normal judgment and insight. Alert and oriented x 3. Normal mood.     Labs on Admission: I have personally reviewed following labs and imaging studies  CBC: Recent Labs  Lab 02/12/18 1336  WBC 5.6  NEUTROABS 3.3  HGB 7.2*  HCT 25.7*  MCV 70.0*  PLT PLATELET CLUMPS NOTED ON SMEAR, COUNT APPEARS ADEQUATE   Basic Metabolic Panel: Recent Labs  Lab 02/12/18 1336  NA 138  K 3.9  CL 107  CO2 22  GLUCOSE 84  BUN 12  CREATININE 0.66  CALCIUM 9.0   GFR: CrCl cannot be calculated (Unknown ideal weight.). Liver Function Tests: No results for input(s): AST, ALT,  ALKPHOS, BILITOT, PROT, ALBUMIN in the last 168 hours. No results for input(s): LIPASE, AMYLASE in the last 168 hours. No results for input(s): AMMONIA in the last 168 hours. Coagulation Profile: No results for input(s): INR, PROTIME in the last 168 hours. Cardiac Enzymes: No results for input(s): CKTOTAL, CKMB, CKMBINDEX, TROPONINI in the last 168 hours. BNP (last 3 results) No results for input(s): PROBNP in the last 8760 hours. HbA1C: No results for input(s): HGBA1C in the last 72 hours. CBG: No results for input(s): GLUCAP in the last 168 hours. Lipid Profile: No results for input(s): CHOL, HDL, LDLCALC, TRIG, CHOLHDL, LDLDIRECT in the last 72 hours. Thyroid Function Tests: No results for input(s): TSH, T4TOTAL, FREET4, T3FREE, THYROIDAB in the last 72 hours. Anemia Panel: No results for input(s): VITAMINB12, FOLATE, FERRITIN, TIBC, IRON, RETICCTPCT in the last 72 hours. Urine analysis: No results found for: COLORURINE, APPEARANCEUR, LABSPEC, PHURINE, GLUCOSEU, HGBUR, BILIRUBINUR, KETONESUR, PROTEINUR, UROBILINOGEN, NITRITE, LEUKOCYTESUR  Radiological Exams on Admission: No results found.  EKG: Independently reviewed.  Normal sinus rhythm.  No acute changes.  Assessment/Plan Principal Problem:   Iron deficiency anemia due to chronic blood loss Active Problems:   Menorrhagia   Severe anemia     1.  Severe symptomatic anemia due to chronic iron deficiency from menorrhagia: Last iron panel showed iron of 15, ferritin of 5, thrombocytosis all consistent with chronic iron deficiency anemia.  Patient is symptomatic now with fatigue and weakness.  Started on 2 units of PRBC by ER physician, agree with transfusion and monitoring overnight.  Will recheck hemoglobin in the morning. On discharge, will suggest high-dose oral iron therapy.  2.  Menorrhagia with uterine fibroids: Discussed in detail with patient and her sister with her permission at bedside that she should follow-up  with OB/GYN and discuss different options for about her treatment of fibroids and menorrhagia as well.  Will refer her to OB/GYN on discharge.  This will be outpatient follow-up.  3.  Thrombocytosis: Due to iron deficiency.    DVT prophylaxis: SCDs. Code Status: Full code. Family Communication: Sister at the bedside. Disposition Plan: Home tomorrow. Consults called: None. Admission status: Observation.   Barb Merino MD [facility] Pager [phone]  If 7PM-7AM, please contact night-coverage [email or web address] Password [email or web address]  02/12/2018, 4:01 PM

## 2018-02-13 DIAGNOSIS — N920 Excessive and frequent menstruation with regular cycle: Secondary | ICD-10-CM

## 2018-02-13 DIAGNOSIS — D500 Iron deficiency anemia secondary to blood loss (chronic): Secondary | ICD-10-CM | POA: Diagnosis not present

## 2018-02-13 DIAGNOSIS — D649 Anemia, unspecified: Secondary | ICD-10-CM | POA: Diagnosis not present

## 2018-02-13 LAB — BPAM RBC
BLOOD PRODUCT EXPIRATION DATE: 202003032359
Blood Product Expiration Date: 202003062359
ISSUE DATE / TIME: 202002072347
ISSUE DATE / TIME: 202002071706
Unit Type and Rh: 6200
Unit Type and Rh: 6200

## 2018-02-13 LAB — TYPE AND SCREEN
ABO/RH(D): A POS
Antibody Screen: NEGATIVE
UNIT DIVISION: 0
Unit division: 0

## 2018-02-13 LAB — CBC
HEMATOCRIT: 29.1 % — AB (ref 36.0–46.0)
Hemoglobin: 8.7 g/dL — ABNORMAL LOW (ref 12.0–15.0)
MCH: 21.1 pg — ABNORMAL LOW (ref 26.0–34.0)
MCHC: 29.9 g/dL — ABNORMAL LOW (ref 30.0–36.0)
MCV: 70.6 fL — ABNORMAL LOW (ref 80.0–100.0)
Platelets: 349 10*3/uL (ref 150–400)
RBC: 4.12 MIL/uL (ref 3.87–5.11)
RDW: 19.5 % — ABNORMAL HIGH (ref 11.5–15.5)
WBC: 6.3 10*3/uL (ref 4.0–10.5)
nRBC: 0 % (ref 0.0–0.2)

## 2018-02-13 MED ORDER — SODIUM CHLORIDE 0.9 % IV SOLN
510.0000 mg | Freq: Once | INTRAVENOUS | Status: AC
  Administered 2018-02-13: 510 mg via INTRAVENOUS
  Filled 2018-02-13: qty 17

## 2018-02-13 MED ORDER — VITAMIN C 250 MG PO TABS: 250.0000 mg | ORAL_TABLET | Freq: Every day | ORAL | 0 refills | Status: AC

## 2018-02-13 MED ORDER — ACETAMINOPHEN 325 MG PO TABS: 650.0000 mg | ORAL_TABLET | Freq: Four times a day (QID) | ORAL | 0 refills | Status: AC | PRN

## 2018-02-13 NOTE — Discharge Summary (Addendum)
Discharge Summary  Babette Stum KCL:[id number] DOB: April 03, 1980  PCP: Raymond Gurney, MD  Admit date: 02/12/2018 Discharge date: 02/13/2018  Time spent: 79mins, more than 50% time spent on coordination of care. Patient does not speak english, family at bedside help translating. Work Quarry manager provided. 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 She reports has onsite pcp at her work place.   Recommendations for Outpatient Follow-up:  1. F/u with PCP within a week  for hospital discharge follow up, repeat cbc/bmp at follow up. pcp to refer patient to GYN and hematology, pcp to check FOBT , patient refused to being tested in the hospital. 2. F/u with gyn 3. F/u with hematology  Discharge Diagnoses:  Active Hospital Problems   Diagnosis Date Noted  . Iron deficiency anemia due to chronic blood loss 02/12/2018  . Menorrhagia 02/12/2018  . Symptomatic anemia 02/12/2018    Resolved Hospital Problems  No resolved problems to display.    Discharge Condition: stable  Diet recommendation: regular diet  Filed Weights   02/12/18 2143  Weight: 95.2 kg    History of present illness: (per admitting MD Dr Sloan Leiter) PCP: Dr. Darlina Guys Patient coming from: home   I have personally briefly reviewed patient's old medical records available.   Chief Complaint: Fatigue and weakness.  HPI: Chelsea Ramirez is a 38 y.o. female with medical history significant of menorrhagia, chronic iron deficiency anemia who is taking oral iron but not responding well presented to the primary care physician's office with symptomatic anemia and was sent to ER.  According to the patient and her sister at the bedside, she has menorrhagia, she is diagnosed with uterine fibroid and bleeds heavily on menstrual.  She has been following up with her primary care physician, she has been taking iron once a day.Since over a month, she feels weak, she feels fatigued.  Short of breath on ambulation.  She  also gets headache and dizziness on walking around.  Denies any hematemesis, hemoptysis.  Denies any nausea.  Appetite is good.  Denies any melena or hematochezia. With this symptoms, she went to her primary care physician today and was sent to ER. ED Course: Hemodynamically stable.  Hemoglobin is 7.2.  Patient declined rectal exam and FOBT testing.  Currently not actively menstruating.  Patient consented for 2 units of red cells. She asked me if I give her some medicine that helps to stop bleeding. She was referred to gynecologist before she has not gone to appointments   Hospital Course:  Principal Problem:   Iron deficiency anemia due to chronic blood loss Active Problems:   Menorrhagia   Symptomatic anemia  1.  Severe symptomatic anemia due to chronic iron deficiency from menorrhagia: Last iron panel showed iron of 15, ferritin of 5, thrombocytosis all consistent with chronic iron deficiency anemia.  Patient is symptomatic now with fatigue and weakness.  s/p  2 units of PRBC  Iv iron x1 Continue oral iron supplement with vit C daily at discharge Follow up with pcp on 2/12, pcp to refer to hematology.  2.  Menorrhagia with uterine fibroids: pcp to refer patient to OB/GYN  D/c ibuprofen and mobic  3.  Thrombocytosis: Due to iron deficiency.    DVT prophylaxis: SCDs. Code Status: Full code. Family Communication: patient and husband at the bedside. Disposition Plan: Home    Discharge Exam: BP (!) 104/58 (BP Location: Left Arm)   Pulse 85   Temp 98.2 F (36.8 C) (Oral)  Resp 17   Wt 95.2 kg   SpO2 100%   BMI 34.40 kg/m   General: NAD Cardiovascular: RRR Respiratory: CTABL  Discharge Instructions You were cared for by a hospitalist during your hospital stay. If you have any questions about your discharge medications or the care you received while you were in the hospital after you are discharged, you can call the unit and asked to speak with the hospitalist on  call if the hospitalist that took care of you is not available. Once you are discharged, your primary care physician will handle any further medical issues. Please note that NO REFILLS for any discharge medications will be authorized once you are discharged, as it is imperative that you return to your primary care physician (or establish a relationship with a primary care physician if you do not have one) for your aftercare needs so that they can reassess your need for medications and monitor your lab values.  Discharge Instructions    Diet general   Complete by:  As directed    Increase activity slowly   Complete by:  As directed      Allergies as of 02/13/2018   No Known Allergies     Medication List    STOP taking these medications   ibuprofen 200 MG tablet Commonly known as:  ADVIL,MOTRIN   meloxicam 7.5 MG tablet Commonly known as:  MOBIC     TAKE these medications   acetaminophen 325 MG tablet Commonly known as:  TYLENOL Take 2 tablets (650 mg total) by mouth every 6 (six) hours as needed for mild pain (or Fever >/= 101).   cyclobenzaprine 10 MG tablet Commonly known as:  FLEXERIL Take 10 mg by mouth at bedtime as needed for muscle spasms.   ferrous sulfate 324 (65 Fe) MG Tbec Take 324 mg by mouth daily after breakfast.   vitamin C 250 MG tablet Commonly known as:  ASCORBIC ACID Take 1 tablet (250 mg total) by mouth daily.      No Known Allergies Follow-up Information    Raymond Gurney, MD Follow up on 02/17/2018.   Specialty:  Family Medicine Why:  hospital discharge follow up, pcp to repeat cbc/bmp /TSH at  follow up, pcp to refer patient to woman's clinic for heavy period, pcp to refer patient to hematology for anemia/iv Iron. Contact information: [place] [address] [phone]            The results of significant diagnostics from this hospitalization (including imaging, microbiology, ancillary and laboratory) are listed below  for reference.    Significant Diagnostic Studies: No results found.  Microbiology: No results found for this or any previous visit (from the past 240 hour(s)).   Labs: Basic Metabolic Panel: Recent Labs  Lab 02/12/18 1336  NA 138  K 3.9  CL 107  CO2 22  GLUCOSE 84  BUN 12  CREATININE 0.66  CALCIUM 9.0   Liver Function Tests: No results for input(s): AST, ALT, ALKPHOS, BILITOT, PROT, ALBUMIN in the last 168 hours. No results for input(s): LIPASE, AMYLASE in the last 168 hours. No results for input(s): AMMONIA in the last 168 hours. CBC: Recent Labs  Lab 02/12/18 1336 02/13/18 0527  WBC 5.6 6.3  NEUTROABS 3.3  --   HGB 7.2* 8.7*  HCT 25.7* 29.1*  MCV 70.0* 70.6*  PLT PLATELET CLUMPS NOTED ON SMEAR, COUNT APPEARS ADEQUATE 349   Cardiac Enzymes: No results for input(s): CKTOTAL, CKMB, CKMBINDEX, TROPONINI  in the last 168 hours. BNP: BNP (last 3 results) No results for input(s): BNP in the last 8760 hours.  ProBNP (last 3 results) No results for input(s): PROBNP in the last 8760 hours.  CBG: No results for input(s): GLUCAP in the last 168 hours.     Signed:  Florencia Reasons MD, PhD  [facility] 02/13/2018, 1:05 PM

## 2018-03-02 ENCOUNTER — Telehealth: Payer: Self-pay | Admitting: Internal Medicine

## 2018-03-02 NOTE — Telephone Encounter (Signed)
A new hem appt has been scheduled for the pt to see Dr. Julien Nordmann on 2/29 at 930am w/labs at [facility]

## 2018-03-03 NOTE — Telephone Encounter (Signed)
Added lab to 2/29 new patient visit. Not able to reach patient to confirm voicemail full. Patient asked in initial call to arrive early for lab.

## 2018-03-05 ENCOUNTER — Other Ambulatory Visit: Payer: Self-pay | Admitting: Internal Medicine

## 2018-03-05 DIAGNOSIS — D500 Iron deficiency anemia secondary to blood loss (chronic): Secondary | ICD-10-CM

## 2018-03-06 ENCOUNTER — Inpatient Hospital Stay: Payer: Self-pay | Admitting: Internal Medicine

## 2018-03-06 ENCOUNTER — Telehealth: Payer: Self-pay

## 2018-03-06 ENCOUNTER — Inpatient Hospital Stay: Payer: Self-pay

## 2018-03-06 NOTE — Telephone Encounter (Signed)
Attempted to call pt x2 this morning to let her know that she had missed an appt today and will need to get it rescheduled. Unable to get a hold of pt phone number d/t full voicemail box. Pt no show, and sent message to new pt coordinator to contact pt next week and reschedule.

## 2018-03-08 NOTE — Progress Notes (Signed)
This encounter was created in error - please disregard.

## 2021-04-04 ENCOUNTER — Other Ambulatory Visit: Payer: Self-pay | Admitting: Nurse Practitioner

## 2021-04-04 ENCOUNTER — Other Ambulatory Visit (HOSPITAL_COMMUNITY)
Admission: RE | Admit: 2021-04-04 | Discharge: 2021-04-04 | Disposition: A | Discharge status: Home / Self Care | Payer: 59 | Source: Ambulatory Visit | Attending: Nurse Practitioner | Admitting: Nurse Practitioner

## 2021-04-04 DIAGNOSIS — Z87410 Personal history of cervical dysplasia: Secondary | ICD-10-CM | POA: Insufficient documentation

## 2021-04-08 LAB — CYTOLOGY - PAP
Comment: NEGATIVE
Diagnosis: NEGATIVE
High risk HPV: NEGATIVE

## 2021-11-01 ENCOUNTER — Emergency Department (HOSPITAL_COMMUNITY): Payer: Commercial Managed Care - HMO

## 2021-11-01 ENCOUNTER — Other Ambulatory Visit: Payer: Self-pay

## 2021-11-01 ENCOUNTER — Encounter (HOSPITAL_COMMUNITY): Payer: Self-pay | Admitting: Emergency Medicine

## 2021-11-01 ENCOUNTER — Emergency Department (HOSPITAL_COMMUNITY)
Admission: EM | Admit: 2021-11-01 | Discharge: 2021-11-01 | Disposition: A | Discharge status: Home / Self Care | Payer: Commercial Managed Care - HMO | Attending: Emergency Medicine | Admitting: Emergency Medicine

## 2021-11-01 DIAGNOSIS — M25511 Pain in right shoulder: Secondary | ICD-10-CM | POA: Diagnosis present

## 2021-11-01 MED ORDER — OXYCODONE-ACETAMINOPHEN 5-325 MG PO TABS
1.0000 | ORAL_TABLET | Freq: Once | ORAL | Status: AC
  Administered 2021-11-01: 1 via ORAL
  Filled 2021-11-01: qty 1

## 2021-11-01 MED ORDER — KETOROLAC TROMETHAMINE 30 MG/ML IJ SOLN
30.0000 mg | Freq: Once | INTRAMUSCULAR | Status: AC
  Administered 2021-11-01: 30 mg via INTRAMUSCULAR
  Filled 2021-11-01: qty 1

## 2021-11-01 MED ORDER — CYCLOBENZAPRINE HCL 10 MG PO TABS
5.0000 mg | ORAL_TABLET | Freq: Once | ORAL | Status: AC
  Administered 2021-11-01: 5 mg via ORAL
  Filled 2021-11-01: qty 1

## 2021-11-01 MED ORDER — CYCLOBENZAPRINE HCL 5 MG PO TABS: 5.0000 mg | ORAL_TABLET | Freq: Two times a day (BID) | ORAL | 0 refills | Status: AC | PRN

## 2021-11-01 MED ORDER — NAPROXEN 500 MG PO TABS: 500.0000 mg | ORAL_TABLET | Freq: Two times a day (BID) | ORAL | 0 refills | Status: AC

## 2021-11-01 MED ORDER — METHOCARBAMOL 500 MG PO TABS
500.0000 mg | ORAL_TABLET | Freq: Once | ORAL | Status: AC
  Administered 2021-11-01: 500 mg via ORAL
  Filled 2021-11-01: qty 1

## 2021-11-01 NOTE — Discharge Instructions (Addendum)
You were seen today for shoulder pain.  Your x-rays are negative for acute fracture.  Take medications as prescribed.  Apply ice if this helps.  Follow-up with orthopedics if not improving.

## 2021-11-01 NOTE — ED Triage Notes (Signed)
Pt c/o right shoulder pain x 1 day. Denies known injury.

## 2021-11-01 NOTE — ED Notes (Signed)
Pt family member came over and said pt would like to have something for pain. Triage nurse Darrick Grinder, RN) made aware.

## 2021-11-01 NOTE — ED Provider Triage Note (Signed)
Emergency Medicine Provider Triage Evaluation Note  Chelsea Ramirez , a 41 y.o. female  was evaluated in triage.  Pt complains of R shoulder pain. Pain was intermittent and sharp throughout the day yesterday, but became constant and worse since the middle of the night. No relief with '800mg'$  ibuprofen q6 hrs. Denies recent trauma or fall. No associated fever, numbness/tingling, paresthesias. Patient RHD.  Review of Systems  Positive: As above Negative: As above  Physical Exam  There were no vitals taken for this visit. Gen:   Awake, no distress   Resp:  Normal effort  MSK:   Moves extremities without difficulty  Other:  Distal radial pulse 2+ in the RUE. Grip strength 5/5 in the R hand. TTP to the anterior R shoulder. No crepitus. Decreased R shoulder abduction 2/2 pain.  Medical Decision Making  Medically screening exam initiated at 6:29 AM.  Appropriate orders placed.  Chelsea Ramirez was informed that the remainder of the evaluation will be completed by another provider, this initial triage assessment does not replace that evaluation, and the importance of remaining in the ED until their evaluation is complete.  Atraumatic RUE/shoulder pain - xray pending.   Antonietta Breach, PA-C 11/01/21 [phone]

## 2021-11-01 NOTE — ED Notes (Signed)
Pt.s daughter came and reported that patient 's pain to shoulder is increasing.  She was given percocet and Robaxin.  I explained to the daughter that it is too soon to give another  dose of Percocet.

## 2021-11-01 NOTE — ED Provider Notes (Signed)
[facility] EMERGENCY DEPARTMENT Provider Note   CSN: [id number] Arrival date & time: 11/01/21  4401     History  Chief Complaint  Patient presents with   Shoulder Pain    Chelsea Ramirez is a 41 y.o. female.  HPI     This is a 41 year old female who presents with concerns for right shoulder pain.  Patient denies any trauma.  She states she had onset of shoulder pain yesterday around 9 PM.  It is worse with certain range of motion.  She took ibuprofen at home with minimal relief.  Denies numbness or tingling of the hand.  Pain does not radiate.  It is mostly over her right deltoid.  Denies chest pain or shortness of breath.  No fevers.  Home Medications Prior to Admission medications   Medication Sig Start Date End Date Taking? Authorizing Provider  cyclobenzaprine (FLEXERIL) 5 MG tablet Take 1 tablet (5 mg total) by mouth 2 (two) times daily as needed for muscle spasms. 11/01/21  Yes Cartina Brousseau, Barbette Hair, MD  naproxen (NAPROSYN) 500 MG tablet Take 1 tablet (500 mg total) by mouth 2 (two) times daily. 11/01/21  Yes Jniyah Dantuono, Barbette Hair, MD  acetaminophen (TYLENOL) 325 MG tablet Take 2 tablets (650 mg total) by mouth every 6 (six) hours as needed for mild pain (or Fever >/= 101). 02/13/18   Florencia Reasons, MD  cyclobenzaprine (FLEXERIL) 10 MG tablet Take 10 mg by mouth at bedtime as needed for muscle spasms.  11/25/17   [provider]  ferrous sulfate 324 (65 Fe) MG TBEC Take 324 mg by mouth daily after breakfast. 10/12/17   [provider]  vitamin C (ASCORBIC ACID) 250 MG tablet Take 1 tablet (250 mg total) by mouth daily. 02/13/18   Florencia Reasons, MD      Allergies    Patient has no known allergies.    Review of Systems   Review of Systems  Musculoskeletal:        Shoulder pain  All other systems reviewed and are negative.   Physical Exam Updated Vital Signs BP 98/70 (BP Location: Right Arm)   Pulse 61   Temp 97.8 F (36.6 C) (Oral)   Resp 14    SpO2 95%  Physical Exam Vitals and nursing note reviewed.  Constitutional:      Appearance: She is well-developed. She is not ill-appearing.  HENT:     Head: Normocephalic and atraumatic.  Eyes:     Pupils: Pupils are equal, round, and reactive to light.  Cardiovascular:     Rate and Rhythm: Normal rate and regular rhythm.  Pulmonary:     Effort: Pulmonary effort is normal. No respiratory distress.  Abdominal:     Palpations: Abdomen is soft.  Musculoskeletal:     Cervical back: Neck supple.     Comments: Focused examination of the right shoulder with tenderness palpation over the right deltoid region, no overlying skin changes, no swelling, normal range of motion, pain with abduction of the shoulder, 2+ radial pulse, neurovascular intact distally  Skin:    General: Skin is warm and dry.  Neurological:     Mental Status: She is alert and oriented to person, place, and time.  Psychiatric:        Mood and Affect: Mood normal.     ED Results / Procedures / Treatments   Labs (all labs ordered are listed, but only abnormal results are displayed) Labs Reviewed - No data to display  EKG None  Radiology DG Shoulder Right  Result Date: 11/01/2021 CLINICAL DATA:  Shoulder pain EXAM: RIGHT SHOULDER - 2+ VIEW COMPARISON:  None Available. FINDINGS: No signs of acute fracture or dislocation. No significant arthropathy identified. Calcifications identified at the insertion of the rotator cuff adjacent to the head of the humerus. IMPRESSION: 1. No acute findings. 2. 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 Electronically Signed   By: Kerby Moors M.D.   On: 11/01/2021 07:57    Procedures Procedures    Medications Ordered in ED Medications  oxyCODONE-acetaminophen (PERCOCET/ROXICET) 5-325 MG per tablet 1 tablet (1 tablet Oral Given 11/01/21 0639)  methocarbamol (ROBAXIN) tablet 500 mg (500 mg Oral Given 11/01/21 0639)  ketorolac  (TORADOL) 30 MG/ML injection 30 mg (30 mg Intramuscular Given 11/01/21 1247)  cyclobenzaprine (FLEXERIL) tablet 5 mg (5 mg Oral Given 11/01/21 1245)    ED Course/ Medical Decision Making/ A&P                           Medical Decision Making Risk Prescription drug management.   This patient presents to the ED for concern of shoulder pain, this involves an extensive number of treatment options, and is a complaint that carries with it a high risk of complications and morbidity.  I considered the following differential and admission for this acute, potentially life threatening condition.  The differential diagnosis includes musculoskeletal pain such as arthritis, strain, soft tissue injury less likely fracture  MDM:    This is a 41 year old female who presents with right shoulder pain.  She is nontoxic and vital signs are reassuring.  Pain seems musculoskeletal in nature and is worse with certain movements.  She has no significant physical exam findings with normal range of motion.  She does have some pain with range of motion.  Recommend scheduled anti-inflammatories at home.  X-rays reviewed.  No evidence of fracture or dislocation.  She does have some calcifications which could be compatible with calcific tendinopathy.  Again would recommend range of motion exercises and anti-inflammatories.  Will give orthopedic follow-up.  (Labs, imaging, consults)  Labs: I Ordered, and personally interpreted labs.  The pertinent results include: None  Imaging Studies ordered: I ordered imaging studies including xray I independently visualized and interpreted imaging. I agree with the radiologist interpretation  Additional history obtained from family at bedside.  External records from outside source obtained and reviewed including prior evaluations  Cardiac Monitoring: The patient was maintained on a cardiac monitor.  I personally viewed and interpreted the cardiac monitored which showed an  underlying rhythm of: Sinus rhythm  Reevaluation: After the interventions noted above, I reevaluated the patient and found that they have :stayed the same  Social Determinants of Health: Lives independently  Disposition:  d/c  Co morbidities that complicate the patient evaluation  Past Medical History:  Diagnosis Date   Anemia      Medicines Meds ordered this encounter  Medications   oxyCODONE-acetaminophen (PERCOCET/ROXICET) 5-325 MG per tablet 1 tablet   methocarbamol (ROBAXIN) tablet 500 mg   ketorolac (TORADOL) 30 MG/ML injection 30 mg   cyclobenzaprine (FLEXERIL) tablet 5 mg   naproxen (NAPROSYN) 500 MG tablet    Sig: Take 1 tablet (500 mg total) by mouth 2 (two) times daily.    Dispense:  30 tablet    Refill:  0   cyclobenzaprine (FLEXERIL) 5 MG tablet    Sig: Take 1 tablet (5 mg total) by  mouth 2 (two) times daily as needed for muscle spasms.    Dispense:  10 tablet    Refill:  0    I have reviewed the patients home medicines and have made adjustments as needed  Problem List / ED Course: Problem List Items Addressed This Visit   None Visit Diagnoses     Acute pain of right shoulder    -  Primary                   Final Clinical Impression(s) / ED Diagnoses Final diagnoses:  Acute pain of right shoulder    Rx / DC Orders ED Discharge Orders          Ordered    naproxen (NAPROSYN) 500 MG tablet  2 times daily        11/01/21 1254    cyclobenzaprine (FLEXERIL) 5 MG tablet  2 times daily PRN        11/01/21 1254              Demaurion Dicioccio, Barbette Hair, MD 11/01/21 1258

## 2022-04-14 ENCOUNTER — Encounter: Payer: Self-pay | Admitting: Nurse Practitioner

## 2022-04-14 DIAGNOSIS — Z1231 Encounter for screening mammogram for malignant neoplasm of breast: Secondary | ICD-10-CM

## 2022-04-21 ENCOUNTER — Other Ambulatory Visit: Payer: Self-pay | Admitting: Nurse Practitioner

## 2022-04-21 DIAGNOSIS — Z1231 Encounter for screening mammogram for malignant neoplasm of breast: Secondary | ICD-10-CM

## 2022-05-28 ENCOUNTER — Ambulatory Visit
Admission: RE | Admit: 2022-05-28 | Discharge: 2022-05-28 | Disposition: A | Discharge status: Home / Self Care | Payer: 59 | Source: Ambulatory Visit | Attending: Nurse Practitioner | Admitting: Nurse Practitioner

## 2022-05-28 DIAGNOSIS — Z1231 Encounter for screening mammogram for malignant neoplasm of breast: Secondary | ICD-10-CM | POA: Diagnosis not present

## 2023-05-11 DIAGNOSIS — Z113 Encounter for screening for infections with a predominantly sexual mode of transmission: Secondary | ICD-10-CM | POA: Diagnosis not present

## 2023-05-11 DIAGNOSIS — N814 Uterovaginal prolapse, unspecified: Secondary | ICD-10-CM | POA: Diagnosis not present

## 2023-05-11 DIAGNOSIS — N8110 Cystocele, unspecified: Secondary | ICD-10-CM | POA: Diagnosis not present

## 2023-05-11 DIAGNOSIS — Z01419 Encounter for gynecological examination (general) (routine) without abnormal findings: Secondary | ICD-10-CM | POA: Diagnosis not present

## 2023-05-11 DIAGNOSIS — Z87410 Personal history of cervical dysplasia: Secondary | ICD-10-CM | POA: Diagnosis not present
